# Patient Record
Sex: Female | Born: 2000 | Race: Black or African American | Hispanic: No | Marital: Single | Smoking: Never smoker
Health system: Southern US, Community
[De-identification: ages and names within clinical notes are randomized; demographics above are authoritative.]

---
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
## 2023-08-05 ENCOUNTER — Emergency Department (HOSPITAL_COMMUNITY): Payer: Medicaid Other

## 2023-08-05 ENCOUNTER — Other Ambulatory Visit: Payer: Self-pay

## 2023-08-05 ENCOUNTER — Encounter (HOSPITAL_COMMUNITY): Payer: Self-pay

## 2023-08-05 ENCOUNTER — Emergency Department (HOSPITAL_COMMUNITY)
Admission: EM | Admit: 2023-08-05 | Discharge: 2023-08-05 | Disposition: A | Discharge status: Home / Self Care | Payer: Medicaid Other | Attending: Emergency Medicine | Admitting: Emergency Medicine

## 2023-08-05 DIAGNOSIS — S6391XA Sprain of unspecified part of right wrist and hand, initial encounter: Secondary | ICD-10-CM | POA: Diagnosis not present

## 2023-08-05 DIAGNOSIS — M79641 Pain in right hand: Secondary | ICD-10-CM | POA: Diagnosis present

## 2023-08-05 MED ORDER — ACETAMINOPHEN 325 MG PO TABS
650.0000 mg | ORAL_TABLET | Freq: Once | ORAL | Status: AC
  Administered 2023-08-05: 650 mg via ORAL
  Filled 2023-08-05: qty 2

## 2023-08-05 NOTE — Discharge Instructions (Signed)
Were seen in the emergency department for your hand pain.  Your workup showed no broken bones though you likely sprained your hand.  We have given you an Ace wrap to help with comfort and with your swelling.  You can continue to ice your hand and keep it elevated and you can take Tylenol and Motrin as needed for pain.  You should follow-up with your primary doctor in the next few days to have your symptoms rechecked.  You should return to the emergency department if you have significantly worsening pain, you develop numbness or weakness in your hand, your fingers turn blue or if you have any other new or concerning symptoms.
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
## 2023-08-05 NOTE — ED Notes (Signed)
Ice pack applied to right hand.
